# Patient Record
Sex: Female | Born: 1955 | Race: White | Hispanic: No | Marital: Married | Smoking: Current every day smoker
Health system: Southern US, Academic
[De-identification: ages and names within clinical notes are randomized; demographics above are authoritative.]

## PROBLEM LIST (undated history)

## (undated) DIAGNOSIS — M47812 Spondylosis without myelopathy or radiculopathy, cervical region: Secondary | ICD-10-CM

## (undated) DIAGNOSIS — I2510 Atherosclerotic heart disease of native coronary artery without angina pectoris: Secondary | ICD-10-CM

## (undated) DIAGNOSIS — E119 Type 2 diabetes mellitus without complications: Secondary | ICD-10-CM

## (undated) DIAGNOSIS — G894 Chronic pain syndrome: Secondary | ICD-10-CM

## (undated) DIAGNOSIS — M109 Gout, unspecified: Secondary | ICD-10-CM

## (undated) DIAGNOSIS — M1990 Unspecified osteoarthritis, unspecified site: Secondary | ICD-10-CM

## (undated) DIAGNOSIS — I509 Heart failure, unspecified: Secondary | ICD-10-CM

## (undated) DIAGNOSIS — I219 Acute myocardial infarction, unspecified: Secondary | ICD-10-CM

## (undated) HISTORY — PX: HX TONSILLECTOMY: SHX27

## (undated) HISTORY — DX: Acute myocardial infarction, unspecified (CMS HCC): I21.9

## (undated) HISTORY — DX: Type 2 diabetes mellitus without complications (CMS HCC): E11.9

## (undated) HISTORY — PX: HX CARPAL TUNNEL RELEASE: SHX101

## (undated) HISTORY — PX: HX WISDOM TEETH EXTRACTION: SHX21

## (undated) HISTORY — PX: CORONARY ARTERY ANGIOPLASTY: PR CATH30428

---

## 2006-09-29 ENCOUNTER — Emergency Department: Payer: Self-pay | Admitting: Emergency Medicine

## 2009-05-28 ENCOUNTER — Emergency Department: Payer: Self-pay | Admitting: Emergency Medicine

## 2009-09-16 ENCOUNTER — Emergency Department: Payer: Self-pay | Admitting: Emergency Medicine

## 2009-10-29 ENCOUNTER — Inpatient Hospital Stay: Payer: Self-pay | Admitting: Internal Medicine

## 2010-04-09 ENCOUNTER — Emergency Department: Payer: Self-pay | Admitting: Emergency Medicine

## 2010-08-02 ENCOUNTER — Emergency Department: Payer: Self-pay | Admitting: Emergency Medicine

## 2010-10-14 ENCOUNTER — Inpatient Hospital Stay: Payer: Self-pay | Admitting: *Deleted

## 2010-11-01 ENCOUNTER — Inpatient Hospital Stay: Payer: Self-pay | Admitting: Internal Medicine

## 2011-02-04 ENCOUNTER — Emergency Department: Payer: Self-pay | Admitting: Emergency Medicine

## 2012-01-12 ENCOUNTER — Emergency Department: Payer: Self-pay | Admitting: Emergency Medicine

## 2012-05-23 ENCOUNTER — Ambulatory Visit: Payer: Self-pay | Admitting: Family Medicine

## 2012-05-23 IMAGING — CR DG CHEST 2V
1 series · 2 of 2 positions shown · non-contrast
Comparison: none

REASON FOR EXAM: SOB
COMMENTS:

[Series 1: view not recorded · 0.17mm/px · 2 of 2 slices shown]
[im 1/2]
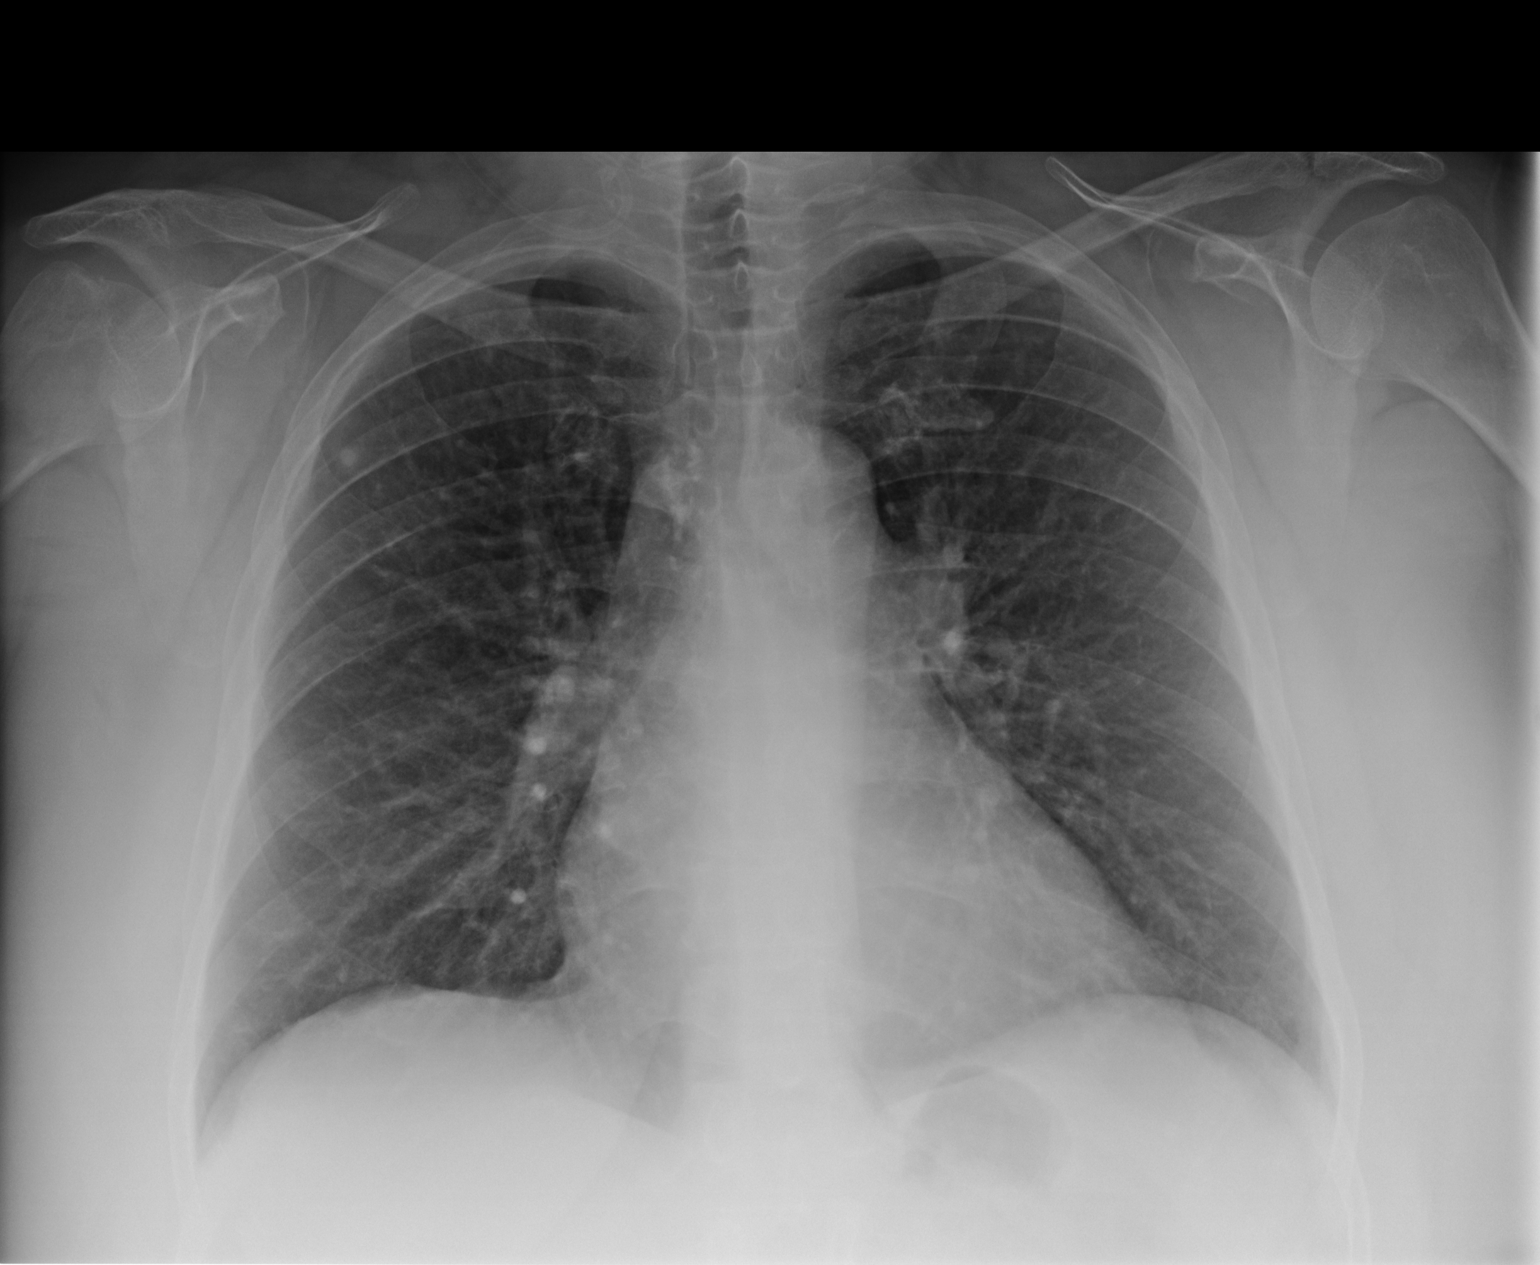
[im 2/2]
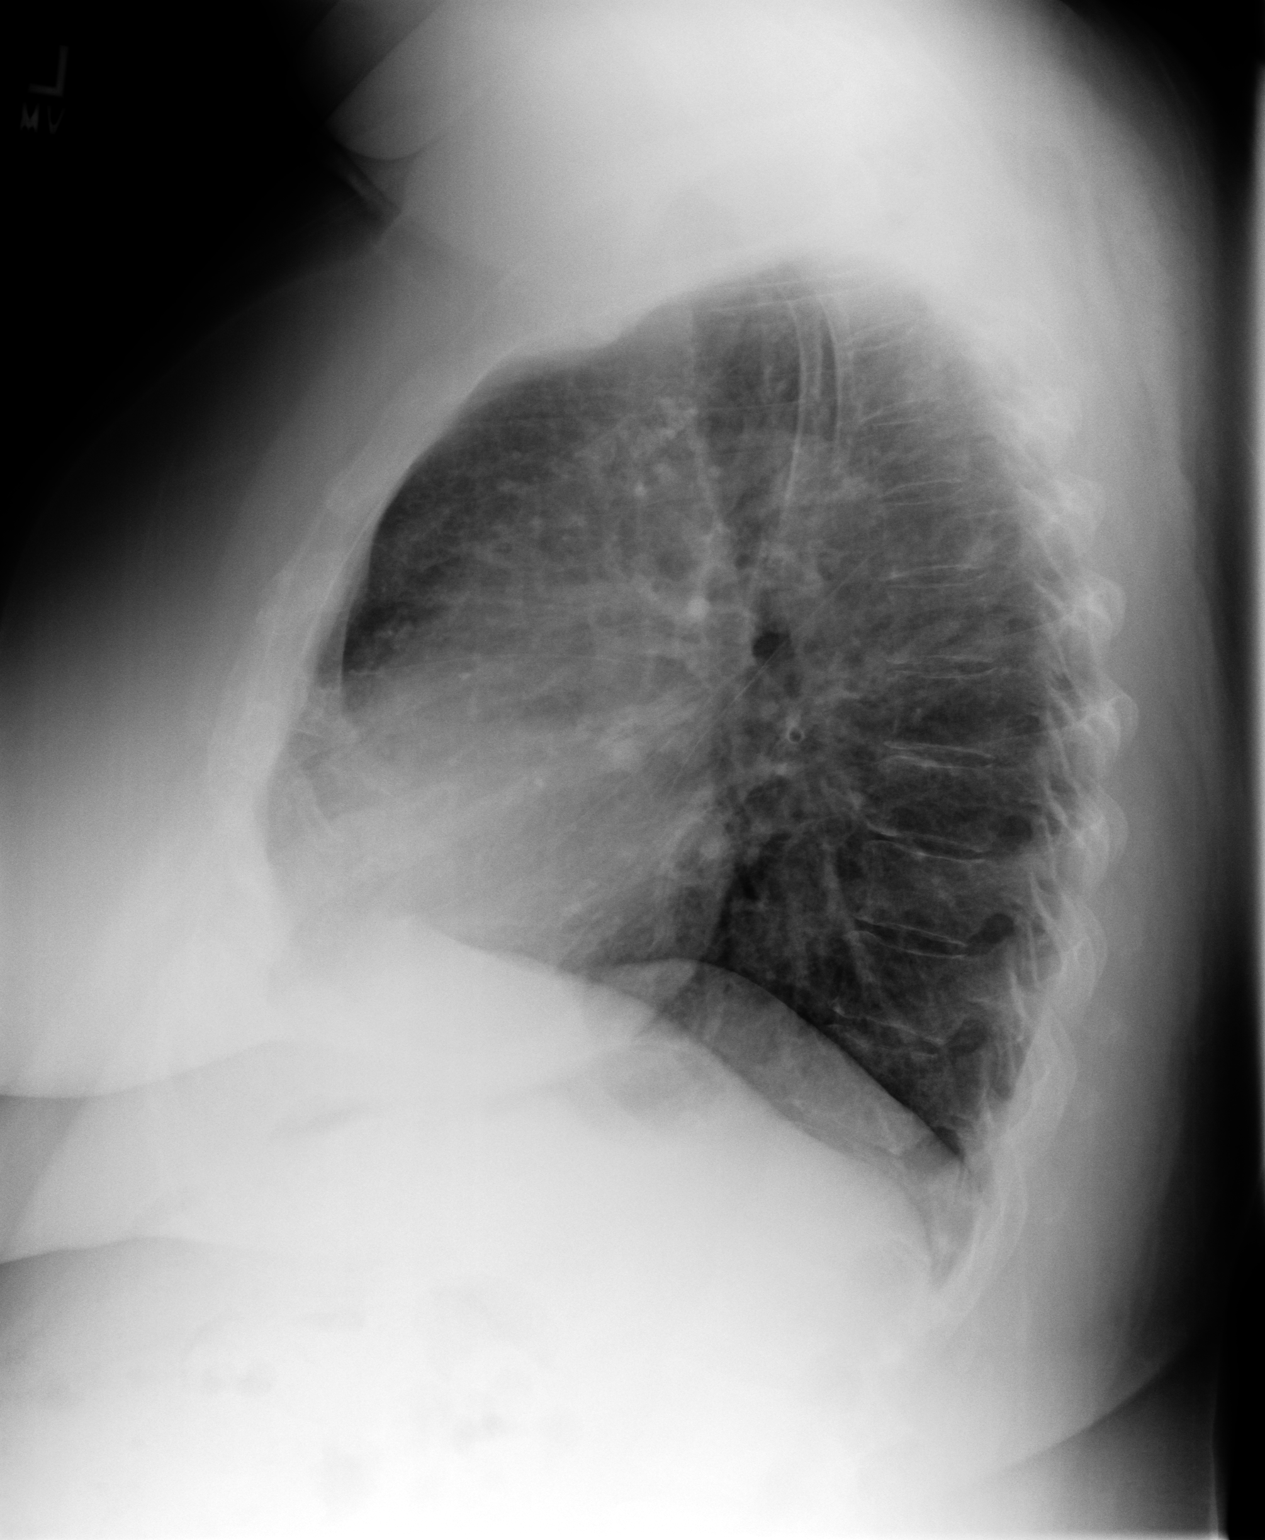

[2 of 2 positions shown; findings below may reference images not displayed]

PROCEDURE:     DXR - DXR CHEST PA (OR AP) AND LATERAL  - October 13, 2010  [DATE]

RESULT:     Comparison is made to the prior exam of 10/29/2009.

There is mild thickening of the lung markings compatible with chronic
inflammatory change. No pneumonia, pneumothorax or pleural effusion is
identified. The heart and pulmonary vasculature are normal in appearance.
IMPRESSION: No acute changes are identified.

## 2012-07-25 ENCOUNTER — Emergency Department: Payer: Self-pay | Admitting: Emergency Medicine

## 2013-02-22 ENCOUNTER — Ambulatory Visit: Payer: Self-pay | Admitting: Family Medicine

## 2013-04-18 ENCOUNTER — Emergency Department: Payer: Self-pay | Admitting: Emergency Medicine

## 2013-04-18 LAB — CBC
HCT: 38.2 % (ref 35.0–47.0)
MCHC: 34.3 g/dL (ref 32.0–36.0)
MCV: 90 fL (ref 80–100)
RBC: 4.24 10*6/uL (ref 3.80–5.20)
RDW: 12.9 % (ref 11.5–14.5)
WBC: 11 10*3/uL (ref 3.6–11.0)

## 2013-09-08 ENCOUNTER — Emergency Department: Payer: Self-pay | Admitting: Emergency Medicine

## 2013-10-09 ENCOUNTER — Emergency Department: Payer: Self-pay | Admitting: Emergency Medicine

## 2014-02-27 ENCOUNTER — Emergency Department: Payer: Self-pay | Admitting: Emergency Medicine

## 2014-08-12 ENCOUNTER — Emergency Department: Payer: Self-pay | Admitting: Emergency Medicine

## 2014-08-12 LAB — URINALYSIS, COMPLETE
BILIRUBIN, UR: NEGATIVE
Bacteria: NONE SEEN
Blood: NEGATIVE
Glucose,UR: NEGATIVE mg/dL (ref 0–75)
KETONE: NEGATIVE
Leukocyte Esterase: NEGATIVE
NITRITE: NEGATIVE
PH: 5 (ref 4.5–8.0)
Protein: NEGATIVE
RBC,UR: <1 /HPF (ref 0–5)
SPECIFIC GRAVITY: 1.004 (ref 1.003–1.030)
WBC UR: <1 /HPF (ref 0–5)

## 2014-11-14 ENCOUNTER — Emergency Department: Payer: Self-pay | Admitting: Emergency Medicine

## 2015-09-03 ENCOUNTER — Encounter: Payer: Self-pay | Admitting: Emergency Medicine

## 2015-09-03 DIAGNOSIS — F17200 Nicotine dependence, unspecified, uncomplicated: Secondary | ICD-10-CM | POA: Diagnosis not present

## 2015-09-03 DIAGNOSIS — R55 Syncope and collapse: Secondary | ICD-10-CM | POA: Diagnosis present

## 2015-09-03 DIAGNOSIS — E119 Type 2 diabetes mellitus without complications: Secondary | ICD-10-CM | POA: Diagnosis not present

## 2015-09-03 LAB — BASIC METABOLIC PANEL
ANION GAP: 9 (ref 5–15)
BUN: 44 mg/dL — ABNORMAL HIGH (ref 6–20)
CALCIUM: 9.1 mg/dL (ref 8.9–10.3)
CHLORIDE: 99 mmol/L — AB (ref 101–111)
CO2: 27 mmol/L (ref 22–32)
Creatinine, Ser: 2.26 mg/dL — ABNORMAL HIGH (ref 0.44–1.00)
GFR calc non Af Amer: 23 mL/min — ABNORMAL LOW (ref >60)
GFR, EST AFRICAN AMERICAN: 27 mL/min — AB (ref >60)
GLUCOSE: 184 mg/dL — AB (ref 65–99)
Potassium: 4.4 mmol/L (ref 3.5–5.1)
Sodium: 135 mmol/L (ref 135–145)

## 2015-09-03 LAB — CBC
HEMATOCRIT: 34.4 % — AB (ref 35.0–47.0)
HEMOGLOBIN: 11.6 g/dL — AB (ref 12.0–16.0)
MCH: 30.7 pg (ref 26.0–34.0)
MCHC: 33.8 g/dL (ref 32.0–36.0)
MCV: 90.8 fL (ref 80.0–100.0)
Platelets: 188 10*3/uL (ref 150–440)
RBC: 3.79 MIL/uL — AB (ref 3.80–5.20)
RDW: 13.9 % (ref 11.5–14.5)
WBC: 9.5 10*3/uL (ref 3.6–11.0)

## 2015-09-03 LAB — TROPONIN I

## 2015-09-03 NOTE — ED Notes (Signed)
Pt to ER states she was in the living room with friends who state that she kept nodding out and when she would come to, all they could see were the white's of her eyes.  Pt states had chest pain on Saturday or Sunday and was seen by her PMD for that.

## 2015-09-03 NOTE — ED Notes (Signed)
Pt unable to give urine sample at this time; pt has specimen cup. 

## 2015-09-04 ENCOUNTER — Emergency Department
Admission: EM | Admit: 2015-09-04 | Discharge: 2015-09-04 | Discharge status: Against Medical Advice | Payer: Medicare Other | Attending: Emergency Medicine | Admitting: Emergency Medicine

## 2015-09-04 HISTORY — DX: Atherosclerotic heart disease of native coronary artery without angina pectoris: I25.10

## 2015-09-04 HISTORY — DX: Type 2 diabetes mellitus without complications: E11.9

## 2015-09-04 HISTORY — DX: Heart failure, unspecified: I50.9

## 2015-09-08 ENCOUNTER — Telehealth: Payer: Self-pay | Admitting: Emergency Medicine

## 2015-09-08 NOTE — ED Notes (Signed)
Called patient due to lwot to inquire about condition and follow up plans. Left message with my number.  Pt has no pcp listed, and we did lab tests with some abnormal values.  I would like her to follow up with pcp.

## 2016-04-22 ENCOUNTER — Emergency Department
Admission: EM | Admit: 2016-04-22 | Discharge: 2016-04-22 | Disposition: A | Discharge status: Home / Self Care | Payer: Medicare Other | Attending: Emergency Medicine | Admitting: Emergency Medicine

## 2016-04-22 ENCOUNTER — Encounter: Payer: Self-pay | Admitting: Emergency Medicine

## 2016-04-22 DIAGNOSIS — I83891 Varicose veins of right lower extremities with other complications: Secondary | ICD-10-CM

## 2016-04-22 DIAGNOSIS — F17200 Nicotine dependence, unspecified, uncomplicated: Secondary | ICD-10-CM | POA: Diagnosis not present

## 2016-04-22 MED ORDER — SILVER NITRATE-POT NITRATE 75-25 % EX MISC
CUTANEOUS | Status: AC
  Filled 2016-04-22: qty 1

## 2016-04-22 NOTE — Discharge Instructions (Signed)
Bleeding Varicose Veins Varicose veins are veins that have become enlarged and twisted. Valves in the veins help return blood from the leg to the heart. If these valves are damaged, blood flows backward and backs up into the veins in the leg near the skin. This causes the veins to become larger because of increased pressure within them. Sometimes these veins bleed. CAUSES  Factors that can lead to bleeding varicose veins include:  Thinning of the skin that covers the veins. This skin is stretched as the veins enlarge.  Weak and thinning walls of the varicose veins. These thin walls are part of the reason why blood is not flowing normally to the heart.  Having high pressure in the veins. This high pressure occurs because the blood is not flowing freely back up to the heart.  Injury. Even a small injury to a varicose vein can cause bleeding.  Open wounds. A sore may develop near a varicose vein and not heal. This makes bleeding more likely.  Taking medicine that thins the blood. These medicines may include aspirin, anti-inflammatory medicine, and other blood thinners. SIGNS AND SYMPTOMS  If bleeding is on the outside surface of the skin, blood can be seen. Sometimes, the bleeding stays under the skin. If this happens, the blue or purple area will spread beyond the vein. This discoloration may be visible. DIAGNOSIS  To decide if you have a bleeding varicose vein, your health care provider may:  Ask about your symptoms. This will include when you first saw bleeding.  Ask about how long you have had varicose veins and if they cause you problems.  Ask about your overall health.  Ask about possible causes, such as recent cuts or if the area near the varicose veins was bumped or injured.  Examine the skin or leg that concerns you. Your health care provider will probably feel the veins.  Order imaging tests. These create detailed pictures of the veins. TREATMENT  The first goal of treating  bleeding varicose veins is to stop the bleeding. Then, the aim is to keep any bleeding from happening again. Treatment will depend on the cause of the bleeding and how bad it is. Ask your health care provider about what would be best for you. Options include:  Raising (elevating) your leg. Lie down with your leg propped up on a pillow or cushion. Your foot should be above the level of your heart.  Applying pressure to the spot that is bleeding. The bleeding should stop in a short time.  Wearing elastic stockings that "compress" your legs (compression stockings). An elastic bandage may do the same thing.  Applying an antibiotic cream on sores that are not healing.  Closing off or surgically removing the bleeding varicose veins with one of the following:  Sclerotherapy. A solution is injected into the vein to close it off.  Laser treatment. A laser is used to heat the vein to close it off.  Radiofrequency vein ablation. An electrical current produced by radio waves is used to close off the vein.  Phlebectomy. The vein is surgically removed through small incisions made over the varicose vein.  Vein ligation and stripping. The vein is surgically removed through incisions made over the varicose vein after the vein has been tied (ligated). HOME CARE INSTRUCTIONS   Apply any creams that your health care provider prescribed. Follow the directions carefully.  Wear compression stockings or any wraps as directed by your health care provider. Make sure you know:  If   you should wear them every day.  How long you should wear them.  If veins were removed or closed, a bandage (dressing) will probably cover the area. Make sure you know:  How often the dressing should be changed.  Whether the area can get wet.  When you can leave the skin uncovered.  Check your skin every day. Look for new sores and signs of bleeding.  To prevent future bleeding:  Use extra care in situations where you could  cut your legs, such as when shaving or gardening.  Try to keep your legs elevated as much as possible. Lie down when you can. SEEK MEDICAL CARE IF:   Your veins continue to bleed.  You develop new sores near your varicose veins.  You have a sore that does not heal or gets bigger.  You have increased pain in your leg.  The area around a varicose vein becomes warm, red, or tender to the touch.  You notice a yellowish fluid that smells bad coming from a spot where there was bleeding.  You have a fever. SEEK IMMEDIATE MEDICAL CARE IF:   You have chest pain or difficulty breathing.  You have severe leg pain.   This information is not intended to replace advice given to you by your health care provider. Make sure you discuss any questions you have with your health care provider.   Document Released: 02/20/2009 Document Revised: 10/25/2014 Document Reviewed: 02/05/2014 Elsevier Interactive Patient Education 2016 [employer]  

## 2016-04-22 NOTE — ED Notes (Signed)
Patient presents to the ED with bleeding from he right lower leg.  Patient states she pulled a tick off her leg yesterday but the head remained in her leg.  Patient states today she removed the head with tweezers and her leg started bleeding and has been bleeding for over 1 hour.  When this RN assessed wound, bright red bleed was oozing rapidly from very small wound.  This RN applied direct pressure for one minute and bandaged area.  Patient takes plavix.

## 2016-04-22 NOTE — ED Notes (Signed)
See triage note  Pulled a tick off and still having some bleeding to right lower leg

## 2016-04-22 NOTE — ED Provider Notes (Signed)
[facility] Emergency Department Provider Note  ____________________________________________  Time seen: Approximately 6:55 PM  I have reviewed the triage vital signs and the nursing notes.   HISTORY  Chief Complaint bleeding post tick bite    HPI Tracie Wright is a 60 y.o. female who presents to the emergency department for evaluation of bleeding to her right calf after pulling a tick off over and hour prior to arrival. She states that she has tried compression and elevation, but the blood is "shooting out."   Past Medical History  Diagnosis Date  . Diabetes mellitus without complication (HCC)   . CHF (congestive heart failure) (HCC)   . Coronary artery disease     There are no active problems to display for this patient.   Past Surgical History  Procedure Laterality Date  . Cesarean section      No current outpatient prescriptions on file.  Allergies Penicillins  No family history on file.  Social History Social History  Substance Use Topics  . Smoking status: Heavy Tobacco Smoker  . Smokeless tobacco: None  . Alcohol Use: None    Review of Systems  Constitutional: Negative for fever/chills Respiratory: Negative for shortness of breath. Musculoskeletal: Negative for pain. Skin: Positive for bleeding. Neurological: Negative for headaches, focal weakness or numbness. ____________________________________________   PHYSICAL EXAM:  VITAL SIGNS: ED Triage Vitals  Enc Vitals Group     BP 04/22/16 1803 103/57 mmHg     Pulse Rate 04/22/16 1803 66     Resp 04/22/16 1803 18     Temp 04/22/16 1803 98.2 F (36.8 C)     Temp Source 04/22/16 1803 Oral     SpO2 04/22/16 1803 95 %     Weight 04/22/16 1803 201 lb (91.173 kg)     Height 04/22/16 1803 5\' 3"  (1.6 m)     Head Cir --      Peak Flow --      Pain Score 04/22/16 1803 0     Pain Loc --      Pain Edu? --      Excl. in GC? --      Constitutional: Alert and oriented.  Well appearing and in no acute distress. Eyes: Conjunctivae are normal. EOMI. Nose: No congestion/rhinnorhea. Mouth/Throat: Mucous membranes are moist.   Neck: No stridor. Cardiovascular: Good peripheral circulation. Respiratory: Normal respiratory effort.  No retractions.  Musculoskeletal: FROM throughout. Neurologic:  Normal speech and language. No gross focal neurologic deficits are appreciated. Skin: Active bleeding from varicose vein on the medial right calf.  ____________________________________________   LABS (all labs ordered are listed, but only abnormal results are displayed)  Labs Reviewed - No data to display ____________________________________________  EKG   ____________________________________________  RADIOLOGY   ____________________________________________   PROCEDURES  Procedure(s) performed:   Silver nitrate stick used with immediate cauterization. Skin adhesive applied over the site as well. ____________________________________________   INITIAL IMPRESSION / ASSESSMENT AND PLAN / ED COURSE  Pertinent labs & imaging results that were available during my care of the patient were reviewed by me and considered in my medical decision making (see chart for details).  Patient ambulated around the room without return of bleeding.   She will be advised to return to the ER if the bleeding returns.  ____________________________________________   FINAL CLINICAL IMPRESSION(S) / ED DIAGNOSES  Final diagnoses:  Bleeding from varicose vein, right    There are no discharge medications for this patient.    Aili Casillas B  Trisha Mangleriplett, FNP 04/22/16 1913  Arnaldo NatalPaul F Malinda, MD 04/22/16 [id number]

## 2016-08-21 ENCOUNTER — Encounter: Payer: Self-pay | Admitting: Emergency Medicine

## 2016-08-21 ENCOUNTER — Emergency Department
Admission: EM | Admit: 2016-08-21 | Discharge: 2016-08-22 | Disposition: A | Discharge status: Home / Self Care | Payer: Medicare Other | Attending: Emergency Medicine | Admitting: Emergency Medicine

## 2016-08-21 DIAGNOSIS — E119 Type 2 diabetes mellitus without complications: Secondary | ICD-10-CM | POA: Insufficient documentation

## 2016-08-21 DIAGNOSIS — T40604A Poisoning by unspecified narcotics, undetermined, initial encounter: Secondary | ICD-10-CM

## 2016-08-21 DIAGNOSIS — I509 Heart failure, unspecified: Secondary | ICD-10-CM | POA: Insufficient documentation

## 2016-08-21 DIAGNOSIS — F17200 Nicotine dependence, unspecified, uncomplicated: Secondary | ICD-10-CM | POA: Insufficient documentation

## 2016-08-21 DIAGNOSIS — I2510 Atherosclerotic heart disease of native coronary artery without angina pectoris: Secondary | ICD-10-CM | POA: Diagnosis not present

## 2016-08-21 HISTORY — DX: Gout, unspecified: M10.9

## 2016-08-21 HISTORY — DX: Unspecified osteoarthritis, unspecified site: M19.90

## 2016-08-21 HISTORY — DX: Chronic pain syndrome: G89.4

## 2016-08-21 HISTORY — DX: Spondylosis without myelopathy or radiculopathy, cervical region: M47.812

## 2016-08-21 LAB — BLOOD GAS, ARTERIAL
ACID-BASE EXCESS: 0.5 mmol/L (ref 0.0–2.0)
BICARBONATE: 26.9 mmol/L (ref 20.0–28.0)
FIO2: 0.21
O2 SAT: 87 %
Patient temperature: 37
pCO2 arterial: 51 mmHg — ABNORMAL HIGH (ref 32.0–48.0)
pH, Arterial: 7.33 — ABNORMAL LOW (ref 7.350–7.450)
pO2, Arterial: 57 mmHg — ABNORMAL LOW (ref 83.0–108.0)

## 2016-08-21 LAB — URINE DRUG SCREEN, QUALITATIVE (ARMC ONLY)
AMPHETAMINES, UR SCREEN: NOT DETECTED
BENZODIAZEPINE, UR SCRN: NOT DETECTED
Barbiturates, Ur Screen: NOT DETECTED
Cannabinoid 50 Ng, Ur ~~LOC~~: NOT DETECTED
Cocaine Metabolite,Ur ~~LOC~~: NOT DETECTED
MDMA (ECSTASY) UR SCREEN: NOT DETECTED
METHADONE SCREEN, URINE: NOT DETECTED
Opiate, Ur Screen: POSITIVE — AB
Phencyclidine (PCP) Ur S: NOT DETECTED
TRICYCLIC, UR SCREEN: NOT DETECTED

## 2016-08-21 LAB — COMPREHENSIVE METABOLIC PANEL
ALT: 18 U/L (ref 14–54)
AST: 64 U/L — ABNORMAL HIGH (ref 15–41)
Albumin: 3.6 g/dL (ref 3.5–5.0)
Alkaline Phosphatase: 67 U/L (ref 38–126)
Anion gap: 8 (ref 5–15)
BUN: 48 mg/dL — ABNORMAL HIGH (ref 6–20)
CHLORIDE: 106 mmol/L (ref 101–111)
CO2: 24 mmol/L (ref 22–32)
CREATININE: 1.69 mg/dL — AB (ref 0.44–1.00)
Calcium: 8.6 mg/dL — ABNORMAL LOW (ref 8.9–10.3)
GFR, EST AFRICAN AMERICAN: 37 mL/min — AB (ref >60)
GFR, EST NON AFRICAN AMERICAN: 32 mL/min — AB (ref >60)
Glucose, Bld: 57 mg/dL — ABNORMAL LOW (ref 65–99)
POTASSIUM: 4.9 mmol/L (ref 3.5–5.1)
SODIUM: 138 mmol/L (ref 135–145)
Total Bilirubin: 1 mg/dL (ref 0.3–1.2)
Total Protein: 7.2 g/dL (ref 6.5–8.1)

## 2016-08-21 LAB — URINALYSIS COMPLETE WITH MICROSCOPIC (ARMC ONLY)
BACTERIA UA: NONE SEEN
Bilirubin Urine: NEGATIVE
Glucose, UA: NEGATIVE mg/dL
Hgb urine dipstick: NEGATIVE
Ketones, ur: NEGATIVE mg/dL
Leukocytes, UA: NEGATIVE
NITRITE: NEGATIVE
PH: 5 (ref 5.0–8.0)
PROTEIN: NEGATIVE mg/dL
RBC / HPF: NONE SEEN RBC/hpf (ref 0–5)
Specific Gravity, Urine: 1.008 (ref 1.005–1.030)

## 2016-08-21 LAB — CBC WITH DIFFERENTIAL/PLATELET
Basophils Absolute: 0.1 10*3/uL (ref 0–0.1)
Basophils Relative: 1 %
EOS ABS: 0.1 10*3/uL (ref 0–0.7)
Eosinophils Relative: 2 %
HEMATOCRIT: 32.8 % — AB (ref 35.0–47.0)
HEMOGLOBIN: 10.9 g/dL — AB (ref 12.0–16.0)
LYMPHS ABS: 1.4 10*3/uL (ref 1.0–3.6)
LYMPHS PCT: 21 %
MCH: 30.8 pg (ref 26.0–34.0)
MCHC: 33.3 g/dL (ref 32.0–36.0)
MCV: 92.5 fL (ref 80.0–100.0)
MONOS PCT: 10 %
Monocytes Absolute: 0.7 10*3/uL (ref 0.2–0.9)
NEUTROS ABS: 4.3 10*3/uL (ref 1.4–6.5)
NEUTROS PCT: 66 %
Platelets: 180 10*3/uL (ref 150–440)
RBC: 3.55 MIL/uL — AB (ref 3.80–5.20)
RDW: 15.1 % — ABNORMAL HIGH (ref 11.5–14.5)
WBC: 6.6 10*3/uL (ref 3.6–11.0)

## 2016-08-21 LAB — TSH: TSH: 0.946 u[IU]/mL (ref 0.350–4.500)

## 2016-08-21 LAB — ACETAMINOPHEN LEVEL

## 2016-08-21 LAB — ETHANOL

## 2016-08-21 LAB — TROPONIN I

## 2016-08-21 LAB — SALICYLATE LEVEL: Salicylate Lvl: <7 mg/dL (ref 2.8–30.0)
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
## 2016-08-21 NOTE — ED Notes (Signed)
Pt refuses to give her medication list stating its none of our business. Pt had clonazepam rx filled yesterday and also an oxycontin rx filled recently. Pt to be held until she is no longer under the influence.
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
## 2016-08-22 NOTE — ED Provider Notes (Signed)
-----------------------------------------   1:43 AM on 08/22/2016 -----------------------------------------  Patient has gotten to use the restroom with nursing help. Reports she has no one to give her a ride home but states that she will walk. Will board patient in the emergency department until she can find a ride.  ----------------------------------------- 2:52 AM on 08/22/2016 -----------------------------------------  Patient is awake, tearful and calling her girlfriend for a ride.  ----------------------------------------- 7:32 AM on 08/22/2016 -----------------------------------------  Patient was unable to reach anyone overnight. Declined offers for grip socks, food, water and repeat vital signs. She ambulated with steady gait and will be discharged to home via taxi. Strict return precautions given. Patient verbalizes understanding and agrees with plan of care.   Irean HongJade J Annamaria Salah, MD 08/22/16 [phone]

## 2016-08-22 NOTE — ED Notes (Signed)
Pt d/c to lobby per MD order. Pt refused vital signs at d/c.

## 2016-08-22 NOTE — ED Notes (Addendum)
Patient awake asking to be discharged. Patient given a phone to call for a ride.

## 2016-08-22 NOTE — ED Notes (Signed)
Pr refused vital signs at discharge.

## 2016-08-22 NOTE — ED Notes (Signed)
Patient resting quietly in no acute distress at this time.  

## 2016-08-22 NOTE — Discharge Instructions (Signed)
Take your medicines only as prescribed by your doctor.  Return to the ER for worsening symptoms, persistent vomiting, difficulty breathing or other concerns. 

## 2016-08-22 NOTE — ED Notes (Signed)
Patient ambulating in hallway a this time. Patient stating that she wants to go home. Patient offered assistance with telephone to find a ride. Patient states that she does not want a phone.

## 2016-08-22 NOTE — ED Notes (Signed)
Patient sitting in chair beside bed. Patient offered assistance with phone, food and water and asked to take vital signs patient continues to refuse at this time. Plan of care discussed with Dr. Dolores FrameSung.

## 2016-08-22 NOTE — ED Notes (Addendum)
Patient sitting on bench in room. Patient offered assistance back to bed, offered sandwich and drink, offered sock and assistance with the phone to call for a ride patient stated that she did not want anything. Patient asked to obtain vital signs. Patient refusing at this time. Plan of care discussed with Dr. Dolores FrameSung.

## 2016-08-22 NOTE — ED Notes (Signed)
Patient removed blood pressure cuff. Patient refusing blood pressure at this time. Patient in no acute distress at this time.

## 2017-01-15 ENCOUNTER — Emergency Department
Admission: EM | Admit: 2017-01-15 | Discharge: 2017-01-16 | Disposition: A | Discharge status: Home / Self Care | Payer: Medicare Other | Attending: Emergency Medicine | Admitting: Emergency Medicine

## 2017-01-15 DIAGNOSIS — R51 Headache: Secondary | ICD-10-CM | POA: Diagnosis not present

## 2017-01-15 DIAGNOSIS — I509 Heart failure, unspecified: Secondary | ICD-10-CM | POA: Insufficient documentation

## 2017-01-15 DIAGNOSIS — I2510 Atherosclerotic heart disease of native coronary artery without angina pectoris: Secondary | ICD-10-CM | POA: Diagnosis not present

## 2017-01-15 DIAGNOSIS — Z5321 Procedure and treatment not carried out due to patient leaving prior to being seen by health care provider: Secondary | ICD-10-CM | POA: Diagnosis not present

## 2017-01-15 DIAGNOSIS — F17200 Nicotine dependence, unspecified, uncomplicated: Secondary | ICD-10-CM | POA: Insufficient documentation

## 2017-01-15 DIAGNOSIS — I110 Hypertensive heart disease with heart failure: Secondary | ICD-10-CM | POA: Diagnosis not present

## 2017-01-15 DIAGNOSIS — E119 Type 2 diabetes mellitus without complications: Secondary | ICD-10-CM | POA: Insufficient documentation

## 2017-01-15 MED ORDER — DIPHENHYDRAMINE HCL 50 MG/ML IJ SOLN
25.0000 mg | Freq: Once | INTRAMUSCULAR | Status: AC
  Administered 2017-01-15: 25 mg via INTRAVENOUS
  Filled 2017-01-15: qty 1

## 2017-01-15 MED ORDER — IBUPROFEN 600 MG PO TABS
600.0000 mg | ORAL_TABLET | Freq: Once | ORAL | Status: AC
  Administered 2017-01-15: 600 mg via ORAL
  Filled 2017-01-15: qty 1

## 2017-01-15 MED ORDER — PROCHLORPERAZINE EDISYLATE 5 MG/ML IJ SOLN
10.0000 mg | Freq: Once | INTRAMUSCULAR | Status: AC
  Administered 2017-01-15: 10 mg via INTRAVENOUS
  Filled 2017-01-15: qty 2

## 2017-01-15 NOTE — ED Notes (Signed)
Spoke with Dr Lamont Snowball and orders placed.

## 2017-01-15 NOTE — ED Triage Notes (Addendum)
Patient reports having headache since yesterday.  Reports it is the worse she has had.  States took blood pressure at local [facility] and it was 239/99.  Patient reports history of CHF and chronic chest pain.

## 2017-01-15 NOTE — ED Notes (Signed)
Spoke with patient about also addressing her chest pain since she was at the ED and had not spoken with her physician about it but patient refused.  States "just make this headache go away."

## 2017-01-16 NOTE — ED Notes (Signed)
Patient verbalizes understanding that she needs to see a provider even though she has received medications for her headache.

## 2017-01-16 NOTE — ED Notes (Signed)
Attempted to round on pt in sub wait and possibly move pt to family room. Pt not found in sub wait, BR, or lobby. IV catheter not seen in blankets or in trash can. Charge Nurse Lea informed.

## 2017-01-16 NOTE — ED Notes (Addendum)
Patient placed in subwait due to having IV placed and to receive medications.

## 2017-01-16 NOTE — ED Notes (Signed)
Attempted to call patient, message states not a working number.

## 2017-01-16 NOTE — ED Notes (Signed)
[place] [employer] will attempt to make contact with patient and determine if IV in place.  If IV in place they will instruct patient to return to ED to have it removed.

## 2017-01-16 NOTE — ED Notes (Addendum)
Spoke with [employer] [place] [employer].  Unable to make contact with patient.
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
## 2017-06-24 ENCOUNTER — Emergency Department
Admission: EM | Admit: 2017-06-24 | Discharge: 2017-06-24 | Disposition: A | Discharge status: Home / Self Care | Payer: Medicare Other | Attending: Emergency Medicine | Admitting: Emergency Medicine

## 2017-06-24 ENCOUNTER — Encounter: Payer: Self-pay | Admitting: Emergency Medicine

## 2017-06-24 ENCOUNTER — Emergency Department: Payer: Medicare Other

## 2017-06-24 DIAGNOSIS — F17210 Nicotine dependence, cigarettes, uncomplicated: Secondary | ICD-10-CM | POA: Diagnosis not present

## 2017-06-24 DIAGNOSIS — Z79899 Other long term (current) drug therapy: Secondary | ICD-10-CM | POA: Insufficient documentation

## 2017-06-24 DIAGNOSIS — I2510 Atherosclerotic heart disease of native coronary artery without angina pectoris: Secondary | ICD-10-CM | POA: Insufficient documentation

## 2017-06-24 DIAGNOSIS — M79671 Pain in right foot: Secondary | ICD-10-CM | POA: Diagnosis present

## 2017-06-24 DIAGNOSIS — E119 Type 2 diabetes mellitus without complications: Secondary | ICD-10-CM | POA: Diagnosis not present

## 2017-06-24 DIAGNOSIS — I509 Heart failure, unspecified: Secondary | ICD-10-CM | POA: Diagnosis not present

## 2017-06-24 DIAGNOSIS — M109 Gout, unspecified: Secondary | ICD-10-CM | POA: Insufficient documentation

## 2017-06-24 DIAGNOSIS — Z7902 Long term (current) use of antithrombotics/antiplatelets: Secondary | ICD-10-CM | POA: Insufficient documentation

## 2017-06-24 DIAGNOSIS — Z7984 Long term (current) use of oral hypoglycemic drugs: Secondary | ICD-10-CM | POA: Insufficient documentation

## 2017-06-24 DIAGNOSIS — G8929 Other chronic pain: Secondary | ICD-10-CM | POA: Insufficient documentation

## 2017-06-24 LAB — CBC
HEMATOCRIT: 30.4 % — AB (ref 35.0–47.0)
Hemoglobin: 10.3 g/dL — ABNORMAL LOW (ref 12.0–16.0)
MCH: 29.3 pg (ref 26.0–34.0)
MCHC: 33.8 g/dL (ref 32.0–36.0)
MCV: 86.6 fL (ref 80.0–100.0)
Platelets: 239 10*3/uL (ref 150–440)
RBC: 3.52 MIL/uL — ABNORMAL LOW (ref 3.80–5.20)
RDW: 15.9 % — AB (ref 11.5–14.5)
WBC: 8 10*3/uL (ref 3.6–11.0)

## 2017-06-24 LAB — COMPREHENSIVE METABOLIC PANEL
ALBUMIN: 3.5 g/dL (ref 3.5–5.0)
ALK PHOS: 67 U/L (ref 38–126)
ALT: 12 U/L — ABNORMAL LOW (ref 14–54)
ANION GAP: 10 (ref 5–15)
AST: 14 U/L — ABNORMAL LOW (ref 15–41)
BILIRUBIN TOTAL: 0.4 mg/dL (ref 0.3–1.2)
BUN: 51 mg/dL — ABNORMAL HIGH (ref 6–20)
CALCIUM: 8.6 mg/dL — AB (ref 8.9–10.3)
CO2: 26 mmol/L (ref 22–32)
Chloride: 100 mmol/L — ABNORMAL LOW (ref 101–111)
Creatinine, Ser: 1.6 mg/dL — ABNORMAL HIGH (ref 0.44–1.00)
GFR, EST AFRICAN AMERICAN: 40 mL/min — AB (ref >60)
GFR, EST NON AFRICAN AMERICAN: 34 mL/min — AB (ref >60)
GLUCOSE: 126 mg/dL — AB (ref 65–99)
Potassium: 3.4 mmol/L — ABNORMAL LOW (ref 3.5–5.1)
Sodium: 136 mmol/L (ref 135–145)
TOTAL PROTEIN: 6.9 g/dL (ref 6.5–8.1)

## 2017-06-24 LAB — URIC ACID: Uric Acid, Serum: 10.3 mg/dL — ABNORMAL HIGH (ref 2.3–6.6)

## 2017-06-24 LAB — SEDIMENTATION RATE: SED RATE: 66 mm/h — AB (ref 0–30)

## 2017-06-24 MED ORDER — COLCHICINE 0.6 MG PO TABS: ORAL_TABLET | ORAL | 0 refills | Status: AC

## 2017-06-24 MED ORDER — NAPROXEN 375 MG PO TABS: 375.0000 mg | ORAL_TABLET | Freq: Two times a day (BID) | ORAL | 0 refills | Status: AC

## 2017-06-24 NOTE — ED Triage Notes (Signed)
Pt in via POV with complaints of right foot pain and swelling x 3 days.  Pt ambulatory to triage, denies any recent injury.  Pt with hx of gout.

## 2017-06-24 NOTE — ED Provider Notes (Signed)
[facility] Emergency Department Provider Note  ____________________________________________  Time seen: Approximately 3:33 PM  I have reviewed the triage vital signs and the nursing notes.   HISTORY  Chief Complaint Foot Pain    HPI Tracie Wright is a 61 y.o. female that presents to the emergency department for ankle and foot pain for 3 days. She states that her ankle "has a fever." Pain feels burning today so she came into the emergency department. Patient denies a history of gout. She does have a history of arthritis. She denies IV drug use. No fever, chills, night sweats, URI symptoms, SOB, CP, nausea, vomiting, abdominal pain.    Past Medical History:  Diagnosis Date  . Arthritis   . Cervical spondylosis   . CHF (congestive heart failure) (HCC)   . Chronic pain syndrome   . Coronary artery disease   . Diabetes mellitus without complication (HCC)   . Gout     There are no active problems to display for this patient.   Past Surgical History:  Procedure Laterality Date  . CESAREAN SECTION      Prior to Admission medications   Medication Sig Start Date End Date Taking? Authorizing Provider  atenolol (TENORMIN) 50 MG tablet Take 50 mg by mouth daily.   Yes [provider]  clopidogrel (PLAVIX) 75 MG tablet Take 75 mg by mouth daily.   Yes [provider]  glipiZIDE (GLUCOTROL) 5 MG tablet Take 5 mg by mouth daily before breakfast.   Yes [provider]  lisinopril (PRINIVIL,ZESTRIL) 20 MG tablet Take 20 mg by mouth daily.   Yes [provider]  oxyCODONE-acetaminophen (PERCOCET/ROXICET) 5-325 MG tablet Take 1 tablet by mouth every 4 (four) hours as needed for severe pain.   Yes [provider]  rosuvastatin (CRESTOR) 10 MG tablet Take 10 mg by mouth daily.   Yes [provider]  tiotropium (SPIRIVA) 18 MCG inhalation capsule Place 18 mcg into inhaler and inhale daily.   Yes [provider]  traZODone (DESYREL) 50 MG tablet Take 50 mg by mouth at bedtime.   Yes [provider]  colchicine 0.6 MG tablet Take 1 tablet three time a day on day 1. Take 1 tablet two times a day on days 2-10. 06/24/17 07/04/17  Enid Derry, PA-C  naproxen (NAPROSYN) 375 MG tablet Take 1 tablet (375 mg total) by mouth 2 (two) times daily with a meal. 06/24/17 06/24/18  Enid Derry, PA-C    Allergies Penicillins  No family history on file.  Social History Social History  Substance Use Topics  . Smoking status: Heavy Tobacco Smoker    Packs/day: 1.50    Types: Cigarettes  . Smokeless tobacco: Never Used  . Alcohol use No     Review of Systems  Constitutional: No fever/chills Cardiovascular: No chest pain. Respiratory: No SOB. Gastrointestinal: No abdominal pain.  No nausea, no vomiting.  Skin: Negative for abrasions, lacerations, ecchymosis. Neurological: Negative for numbness or tingling   ____________________________________________   PHYSICAL EXAM:  VITAL SIGNS: ED Triage Vitals  Enc Vitals Group     BP 06/24/17 1452 (!) 137/52     Pulse Rate 06/24/17 1452 74     Resp 06/24/17 1452 18     Temp 06/24/17 1452 97.9 F (36.6 C)     Temp Source 06/24/17 1452 Oral     SpO2 06/24/17 1452 97 %     Weight 06/24/17 1453 187 lb (84.8 kg)  Height 06/24/17 1453 5\' 3"  (1.6 m)     Head Circumference --      Peak Flow --      Pain Score 06/24/17 1451 7     Pain Loc --      Pain Edu? --      Excl. in GC? --      Constitutional: Alert and oriented. Well appearing and in no acute distress. Eyes: Conjunctivae are normal. PERRL. EOMI. Head: Atraumatic. ENT:      Ears:      Nose: No congestion/rhinnorhea.      Mouth/Throat: Mucous membranes are moist.  Neck: No stridor. Cardiovascular: Normal rate, regular rhythm.  Good peripheral circulation. Respiratory: Normal respiratory effort without tachypnea or retractions. Lungs CTAB. Good air entry to the bases  with no decreased or absent breath sounds. Musculoskeletal: No gross deformities appreciated. Tenderness to palpation over right lateral ankle. Moderate erythema, swelling, heat to right lateral ankle. Neurologic:  Normal speech and language. No gross focal neurologic deficits are appreciated.  Skin:  Skin is warm, dry and intact. No rash noted.   ____________________________________________   LABS (all labs ordered are listed, but only abnormal results are displayed)  Labs Reviewed  URIC ACID - Abnormal; Notable for the following:       Result Value   Uric Acid, Serum 10.3 (*)    All other components within normal limits  CBC - Abnormal; Notable for the following:    RBC 3.52 (*)    Hemoglobin 10.3 (*)    HCT 30.4 (*)    RDW 15.9 (*)    All other components within normal limits  COMPREHENSIVE METABOLIC PANEL - Abnormal; Notable for the following:    Potassium 3.4 (*)    Chloride 100 (*)    Glucose, Bld 126 (*)    BUN 51 (*)    Creatinine, Ser 1.60 (*)    Calcium 8.6 (*)    AST 14 (*)    ALT 12 (*)    GFR calc non Af Amer 34 (*)    GFR calc Af Amer 40 (*)    All other components within normal limits  SEDIMENTATION RATE - Abnormal; Notable for the following:    Sed Rate 66 (*)    All other components within normal limits  C-REACTIVE PROTEIN   ____________________________________________  EKG   ____________________________________________  RADIOLOGY Lexine BatonI, Kaidence Sant, personally viewed and evaluated these images (plain radiographs) as part of my medical decision making, as well as reviewing the written report by the radiologist.  Dg Ankle Complete Right  Result Date: 06/24/2017 CLINICAL DATA:  Pt c/o right ankle pain, swelling, and erythema x3 days with no reported injury. Hx of diabetes, gout, RA. Denies previous surgery to right ankle. EXAM: RIGHT ANKLE - COMPLETE 3+ VIEW COMPARISON:  None. FINDINGS: There is moderate, diffuse soft tissue swelling of the ankle. No  acute fracture or subluxation. No radiopaque foreign body or soft tissue gas. Small plantar and Achilles spurs are present. IMPRESSION: Moderate soft tissue swelling. No evidence for acute osseous abnormality. Electronically Signed   By: Norva PavlovElizabeth  Brown M.D.   On: 06/24/2017 15:59    ____________________________________________    PROCEDURES  Procedure(s) performed:    Procedures    Medications - No data to display   ____________________________________________   INITIAL IMPRESSION / ASSESSMENT AND PLAN / ED COURSE  Pertinent labs & imaging results that were available during my care of the patient were reviewed by me and considered in my  medical decision making (see chart for details).  Review of the [place] CSRS was performed in accordance of the [facility] prior to dispensing any controlled drugs.   She presented to the emergency department for ankle pain for 3 days. Vital signs and exam are reassuring. No acute bony abnormalities on x-ray. CBC and CMP values are similar to previous labs. She does not have a white count. Uric acid elevated at 10.3. Patient will be discharged home with prescriptions for colchicine and naproxen. Patient states that her blood sugar goes up to 700 with prednisone. Patient is to follow up with PCP as directed. Patient is given ED precautions to return to the ED for any worsening or new symptoms.     ____________________________________________  FINAL CLINICAL IMPRESSION(S) / ED DIAGNOSES  Final diagnoses:  Acute gout of right ankle, unspecified cause      NEW MEDICATIONS STARTED DURING THIS VISIT:  Discharge Medication List as of 06/24/2017  5:19 PM    START taking these medications   Details  colchicine 0.6 MG tablet Take 1 tablet three time a day on day 1. Take 1 tablet two times a day on days 2-10., Print    naproxen (NAPROSYN) 375 MG tablet Take 1 tablet (375 mg total) by mouth 2 (two) times daily with a meal., Starting Fri 06/24/2017, Until  Sat 06/24/2018, Print            This chart was dictated using voice recognition software/Dragon. Despite best efforts to proofread, errors can occur which can change the meaning. Any change was purely unintentional.    Enid Derry, PA-C 06/24/17 1821    Jene Every, MD 06/27/17 [phone]

## 2017-06-24 NOTE — ED Notes (Addendum)
See triage note   States she developed pain and swelling to right foot about 3 days ago   Denies any injury is able to bear wt with increased pain  denies any hx of gout

## 2017-06-25 LAB — C-REACTIVE PROTEIN: CRP: 6.3 mg/dL — AB (ref <1.0)

## 2017-06-27 ENCOUNTER — Telehealth: Payer: Self-pay | Admitting: Emergency Medicine

## 2017-06-27 NOTE — Telephone Encounter (Signed)
Called patient to inform of crp result 6.3.  Her pcp is not listed, so I would like her to relay this to her pcp or tell me who it is so I can relay it to them.  Left message  Asking her to call me.

## 2018-06-05 ENCOUNTER — Encounter (INDEPENDENT_AMBULATORY_CARE_PROVIDER_SITE_OTHER): Payer: Self-pay | Admitting: Student in an Organized Health Care Education/Training Program

## 2018-06-05 NOTE — Progress Notes (Signed)
Multi State Controlled Substance Patient Full Name Report Report Date 06/05/2018   From 06/05/2017 To 06/05/2018 Date of Birth [birth year]   Prescription Count 0   Last Name Williams First Name Eileen Middle Name                   Patients included in report that appear to match the search criteria.   Last Name First Name Middle Name Gender Address         Prescriber Name Prescriber DEA & Zip Dispenser Name Dispenser DEA & Zip Rx Written Date Rx Dispense Date & Date Sold Rx Number Product Name Strength Qty Days # of Refill Sched Payment Type

## 2018-06-07 ENCOUNTER — Encounter (INDEPENDENT_AMBULATORY_CARE_PROVIDER_SITE_OTHER): Payer: Self-pay | Admitting: Student in an Organized Health Care Education/Training Program

## 2018-06-07 ENCOUNTER — Ambulatory Visit
Payer: Medicare Other | Attending: Student in an Organized Health Care Education/Training Program | Admitting: Student in an Organized Health Care Education/Training Program

## 2018-06-07 VITALS — BP 136/68 | HR 57 | Temp 97.7°F | Resp 20 | Ht 64.45 in | Wt 203.9 lb

## 2018-06-07 DIAGNOSIS — M069 Rheumatoid arthritis, unspecified: Secondary | ICD-10-CM | POA: Insufficient documentation

## 2018-06-07 DIAGNOSIS — M109 Gout, unspecified: Secondary | ICD-10-CM

## 2018-06-07 DIAGNOSIS — F17210 Nicotine dependence, cigarettes, uncomplicated: Secondary | ICD-10-CM | POA: Insufficient documentation

## 2018-06-07 DIAGNOSIS — E559 Vitamin D deficiency, unspecified: Secondary | ICD-10-CM

## 2018-06-07 DIAGNOSIS — G629 Polyneuropathy, unspecified: Secondary | ICD-10-CM | POA: Insufficient documentation

## 2018-06-07 DIAGNOSIS — M79673 Pain in unspecified foot: Secondary | ICD-10-CM | POA: Insufficient documentation

## 2018-06-07 DIAGNOSIS — E1140 Type 2 diabetes mellitus with diabetic neuropathy, unspecified: Secondary | ICD-10-CM

## 2018-06-07 DIAGNOSIS — M5136 Other intervertebral disc degeneration, lumbar region: Secondary | ICD-10-CM

## 2018-06-07 DIAGNOSIS — M797 Fibromyalgia: Secondary | ICD-10-CM | POA: Insufficient documentation

## 2018-06-07 DIAGNOSIS — M51369 Other intervertebral disc degeneration, lumbar region without mention of lumbar back pain or lower extremity pain: Secondary | ICD-10-CM

## 2018-06-07 NOTE — Progress Notes (Signed)
Eileen Williams  06/07/2018  [id number]    HPI:  Pt is a 62yo female with PMH DM, diabetic peripheral neuropathy, gout, fibromyalgia, vitamin D deficiency who was referred by her rheumatologist for med eval.  Pt was started on oxycontin 60mg  BID and percocet 5/325 Q4-6 hours by her PCP in for rheumatoid arthritis.  Pt moved about one year ago, but her PCP has been supporting her with additional medications until she can find a new doctor.  Her rheumatologist started her on Cymbalta recently per their note, but she cannot remember if she is taking this.  Pain is diffuse all over her body, but generally worse in the joints.  It is better with OxyContin, worse with movement. These records were obtained from OSH as were reviewed as above.         Pain Rating Scale     On a scale of 0-10, during the past 24 hours, pain has interfered with you usual activity: 8     On a scale of 0-10, during the past 24 hours, pain has interfered with your sleep: 8    On a scale of 0-10, during the past 24 hours, pain has affected your mood: 4     On a scale of 0-10, during the past 24 hours, pain has contributed to your stress: 8     On a scale of 0-10, what is your overall pain Rating: 9    No flowsheet data found.    Past Medical History:   Diagnosis Date   . Diabetes mellitus, type 2 (CMS HCC)    . MI (myocardial infarction) (CMS [facility])          Past Surgical History:   Procedure Laterality Date   . CORONARY ARTERY ANGIOPLASTY     . HX CARPAL TUNNEL RELEASE Bilateral    . HX CESAREAN SECTION     . HX TONSILLECTOMY     . HX WISDOM TEETH EXTRACTION           Family Medical History:     None      Denies family history of chronic pain      Social History     Socioeconomic History   . Marital status: Married     Spouse name: Not on file   . Number of children: Not on file   . Years of education: Not on file   . Highest education level: Not on file   Tobacco Use   . Smoking status: Current Every Day Smoker     Packs/day: 1.00      Years: 41.00     Pack years: 41.00     Start date: 41   . Smokeless tobacco: Never Used     Current Outpatient Medications   Medication Sig   . albuterol sulfate (PROVENTIL) 2.5 mg /3 mL (0.083 %) Inhalation Solution for Nebulization 0.08 mg by Nebulization route Every 4 hours as needed for Wheezing   . Aspirin-Caffeine (BC) 845-65 mg Oral Powder in Packet Take by mouth   . atenolol (TENORMIN) 50 mg Oral Tablet Take 50 mg by mouth Once a day   . clopidogrel (PLAVIX) 75 mg Oral Tablet Take 75 mg by mouth Once a day   . ferrous sulfate (FERATAB) 324 mg (65 mg iron) Oral Tablet, Delayed Release (E.C.) Take 325 mg by mouth Twice daily   . furosemide (LASIX) 40 mg Oral Tablet Take 40 mg by mouth Once a day   . glipiZIDE (GLUCOTROL)  5 mg Oral Tablet Take 5 mg by mouth Every morning before breakfast Take 30 minutes before meals   . lisinopril (PRINIVIL) 20 mg Oral Tablet Take 20 mg by mouth Once a day   . melatonin 10 mg Oral Capsule Take by mouth   . oxyCODONE 60 mg Oral tablet,oral only,ext.rel.12 hr Take 60 mg by mouth Twice daily   . oxyCODONE-acetaminophen (PERCOCET) 7.5-325 mg Oral Tablet Take 1 Tab by mouth Every 4 hours as needed for Pain   . potassium chloride (K-DUR) 20 mEq Oral Tab Sust.Rel. Particle/Crystal Take 90 mEq by mouth Every MON, THURS and SAT   . tiotropium bromide (SPIRIVA HANDIHALER) 18 mcg Inhalation Capsule, w/Inhalation Device Take 18 mcg by inhalation Once a day     Allergies   Allergen Reactions   . Penicillins      hives     BP 136/68   Pulse 57   Temp 36.5 C (97.7 F) (Oral)   Resp 20   Ht 1.637 m (5' 4.45")   Wt 92.5 kg (203 lb 14.8 oz)   SpO2 98%   BMI 34.52 kg/m         LABS:  No results found for this or any previous visit (from the past 24 hour(s)).    ROS:  GENERAL: Negative for fevers or chills  NEUROLOGIC: Negative for any seizures, ataxia.  HEENT: Negative for any head trauma, neck trauma  CARDIAC: Negative for any chest pain, dyspnea on exertion  PULMONARY: Negative for  any shortness of breath, wheezing  GASTROINTESTINAL: Negative for any abdominal pain, nausea, vomiting  GENITOURINARY: Negative for any dysuria, hematuria, incontinence.  INTEGUMENTARY: Negative for any rashes, cuts.  RHEUMATOLOGIC: Negative for any joint pains, photosensitive rashes.  HEMATOLOGIC: Negative for any abnormal bruising or bleeding.    PE:  CONST: Well developed, no acute distress  HEENT: normocephalic, atraumatic, external ear without abnormality  EYES: Conjunctiva normal, no scleral icterus  CV: Acyanotic, no JVD  RESP: Respiratory effort normal, no tachypnea  SKIN: No rash or lesions.     MUSCULOSKELETAL No obvious deformity.  Normal gait and station.    NEURO: A&O x 3, moving all 4 extremities  PSYCH:  Non-combative.  Tearful when discussing mother's death, opioid pain medications.    A/P:  Explained to this patient that I would not recommend treating her fibromyalgia, neuropathy or rheumatoid arthritis with high dose opioids.  She is upset and states "nobody believes that I am in pain".  She has poor insight and does not understand that her medications are high dose because the dosing interval is twice daily.  I have offered her a behavioral health evaluation to determine if she would be a candidate for a taper of her medications.  She has agreed to this treatment plan for now.  I have also explained to her that we have non-opioid therapy options for these conditions which may be very effective and could be discussed at future visits.      I have reviewed and updated as appropriate the past medical, family and social history.    Any imaging studies provided by the patient for this office visit were reviewed and discussed with the patient. When practical, the images were reviewed in the presence of the patient.    Ellan Lambert, MD  06/07/2018, 14:23

## 2018-06-28 ENCOUNTER — Telehealth (INDEPENDENT_AMBULATORY_CARE_PROVIDER_SITE_OTHER): Payer: Self-pay | Admitting: Student in an Organized Health Care Education/Training Program

## 2018-06-28 NOTE — Telephone Encounter (Signed)
CM received the following message from call center:    "Pt is calling stating she is going through withdraws from other medication she was prescribed from another clinic. Pt was very rude on the phone stating that she was dismissed from our clinic after having a new patient visit and we did not help her. I see the order that was placed for the bmed pain last month but it was never scheduled. Told the pt this. She wants a call back to schedule if you would like to do that. Thank you."    I returned patient's call and informed her that I could schedule her for a psychological evaluation but that Dr. Warren Danes indicated that she would not be prescribing opioids to patient. Patient states that she was under the impression that she would get pain medication as long as she went through the psychological evaluation.  Patient scheduled for 07/10/18 with Enrique Sack for a psychological evaluation. I explained that once this eval was completed, we could schedule a f/u with Dr. Warren Danes if needed.  06/28/2018  Salena Saner, CASE MANAGER  06/28/2018, 14:44

## 2018-07-10 ENCOUNTER — Ambulatory Visit (INDEPENDENT_AMBULATORY_CARE_PROVIDER_SITE_OTHER): Payer: Medicare Other

## 2018-07-10 DIAGNOSIS — F451 Undifferentiated somatoform disorder: Secondary | ICD-10-CM

## 2018-07-10 DIAGNOSIS — F17200 Nicotine dependence, unspecified, uncomplicated: Secondary | ICD-10-CM
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
## 2018-07-20 ENCOUNTER — Telehealth (INDEPENDENT_AMBULATORY_CARE_PROVIDER_SITE_OTHER): Payer: Self-pay | Admitting: Student in an Organized Health Care Education/Training Program

## 2018-07-20 NOTE — Telephone Encounter (Signed)
CM spoke w/ patient regarding scheduling a f/u not for medications w/ provider. Patient stated there was "nothing controlling my pain right now." Scheduled for 30 minutes on 08/03/18.     07/20/2018  Eileen Williams, CASE MANAGER  07/20/2018, 14:15  \

## 2018-07-26 ENCOUNTER — Ambulatory Visit (INDEPENDENT_AMBULATORY_CARE_PROVIDER_SITE_OTHER): Payer: Self-pay | Admitting: Student in an Organized Health Care Education/Training Program

## 2018-07-26 NOTE — Telephone Encounter (Signed)
-----   Message from Eileen Williams sent at 07/25/2018  3:25 PM EDT -----  Gilman Buttner,    Pt is wanting a call back regarding the letter stating she is not a candidate for pain meds.

## 2018-07-26 NOTE — Telephone Encounter (Signed)
CM called patient back regarding her reciveing a letter. Patient stated "I want to know why you won't treat me." CM explained that letter is not a refusal to treat the patient. CM explained patient is welcome to come back and discuss other treatment modalities with her provider and looks like this has been scheduled for 08/03/18. Patient stated she would like to know some of those options before she makes the trip. CM offered to get this message to provider. Patient will keep appt for now.     07/26/2018  Eileen Williams, CASE MANAGER  07/26/2018, 13:37

## 2018-08-01 ENCOUNTER — Encounter (INDEPENDENT_AMBULATORY_CARE_PROVIDER_SITE_OTHER): Payer: Self-pay | Admitting: Student in an Organized Health Care Education/Training Program

## 2018-08-01 NOTE — Progress Notes (Signed)
[place] Controlled Substance Patient Full Name Report Report Date 08/01/2018   From 08/01/2017 To 08/01/2018 Date of Birth 1956-10-03   Prescription Count 0   Last Name Williams First Name Eileen Middle Name                   Patients included in report that appear to match the search criteria.   Last Name First Name Middle Name Gender Address         Prescriber Name Prescriber DEA & Zip Dispenser Name Dispenser DEA & Zip Rx Written Date Rx Dispense Date & Date Sold Rx Number Product Name Strength Qty Days # of Refill Sched Payment Type

## 2018-08-03 ENCOUNTER — Encounter (INDEPENDENT_AMBULATORY_CARE_PROVIDER_SITE_OTHER): Payer: Self-pay

## 2018-08-03 ENCOUNTER — Ambulatory Visit (INDEPENDENT_AMBULATORY_CARE_PROVIDER_SITE_OTHER): Payer: Self-pay | Admitting: Student in an Organized Health Care Education/Training Program

## 2019-02-10 ENCOUNTER — Ambulatory Visit (HOSPITAL_COMMUNITY): Payer: Self-pay | Admitting: Family

## 2019-03-19 DEATH — deceased
